# Patient Record
Sex: Female | Born: 1940 | Race: Black or African American | Hispanic: No | Smoking: Former smoker
Health system: Southern US, Community
[De-identification: ages and names within clinical notes are randomized; demographics above are authoritative.]

## PROBLEM LIST (undated history)

## (undated) HISTORY — PX: BREAST BIOPSY: SHX20

## (undated) HISTORY — PX: BREAST EXCISIONAL BIOPSY: SUR124

## (undated) HISTORY — PX: OTHER SURGICAL HISTORY: SHX169

---

## 2014-08-28 ENCOUNTER — Emergency Department: Payer: Medicare Other

## 2014-08-28 ENCOUNTER — Encounter: Payer: Self-pay | Admitting: Emergency Medicine

## 2014-08-28 ENCOUNTER — Emergency Department
Admission: EM | Admit: 2014-08-28 | Discharge: 2014-08-28 | Disposition: A | Discharge status: Home / Self Care | Payer: Medicare Other | Attending: Emergency Medicine | Admitting: Emergency Medicine

## 2014-08-28 DIAGNOSIS — Z87891 Personal history of nicotine dependence: Secondary | ICD-10-CM | POA: Insufficient documentation

## 2014-08-28 DIAGNOSIS — W010XXA Fall on same level from slipping, tripping and stumbling without subsequent striking against object, initial encounter: Secondary | ICD-10-CM | POA: Insufficient documentation

## 2014-08-28 DIAGNOSIS — M25562 Pain in left knee: Secondary | ICD-10-CM

## 2014-08-28 DIAGNOSIS — S93402A Sprain of unspecified ligament of left ankle, initial encounter: Secondary | ICD-10-CM | POA: Insufficient documentation

## 2014-08-28 DIAGNOSIS — Y998 Other external cause status: Secondary | ICD-10-CM | POA: Insufficient documentation

## 2014-08-28 DIAGNOSIS — Y9389 Activity, other specified: Secondary | ICD-10-CM | POA: Insufficient documentation

## 2014-08-28 DIAGNOSIS — Y9289 Other specified places as the place of occurrence of the external cause: Secondary | ICD-10-CM | POA: Diagnosis not present

## 2014-08-28 DIAGNOSIS — S8992XA Unspecified injury of left lower leg, initial encounter: Secondary | ICD-10-CM | POA: Diagnosis present

## 2014-08-28 DIAGNOSIS — S4991XA Unspecified injury of right shoulder and upper arm, initial encounter: Secondary | ICD-10-CM | POA: Diagnosis not present

## 2014-08-28 DIAGNOSIS — M25511 Pain in right shoulder: Secondary | ICD-10-CM

## 2014-08-28 MED ORDER — OXYCODONE-ACETAMINOPHEN 5-325 MG PO TABS
1.0000 | ORAL_TABLET | Freq: Once | ORAL | Status: DC
  Filled 2014-08-28: qty 1

## 2014-08-28 MED ORDER — ACETAMINOPHEN 325 MG PO TABS
650.0000 mg | ORAL_TABLET | Freq: Once | ORAL | Status: AC
  Administered 2014-08-28: 650 mg via ORAL
  Filled 2014-08-28: qty 2

## 2014-08-28 NOTE — ED Notes (Signed)
Pt has declined any opioid medications.

## 2014-08-28 NOTE — Discharge Instructions (Signed)
Ankle Sprain °An ankle sprain is an injury to the strong, fibrous tissues (ligaments) that hold the bones of your ankle joint together.  °CAUSES °An ankle sprain is usually caused by a fall or by twisting your ankle. Ankle sprains most commonly occur when you step on the outer edge of your foot, and your ankle turns inward. People who participate in sports are more prone to these types of injuries.  °SYMPTOMS  °· Pain in your ankle. The pain may be present at rest or only when you are trying to stand or walk. °· Swelling. °· Bruising. Bruising may develop immediately or within 1 to 2 days after your injury. °· Difficulty standing or walking, particularly when turning corners or changing directions. °DIAGNOSIS  °Your caregiver will ask you details about your injury and perform a physical exam of your ankle to determine if you have an ankle sprain. During the physical exam, your caregiver will press on and apply pressure to specific areas of your foot and ankle. Your caregiver will try to move your ankle in certain ways. An X-ray exam may be done to be sure a bone was not broken or a ligament did not separate from one of the bones in your ankle (avulsion fracture).  °TREATMENT  °Certain types of braces can help stabilize your ankle. Your caregiver can make a recommendation for this. Your caregiver may recommend the use of medicine for pain. If your sprain is severe, your caregiver may refer you to a surgeon who helps to restore function to parts of your skeletal system (orthopedist) or a physical therapist. °HOME CARE INSTRUCTIONS  °· Apply ice to your injury for 1-2 days or as directed by your caregiver. Applying ice helps to reduce inflammation and pain. °¨ Put ice in a plastic bag. °¨ Place a towel between your skin and the bag. °¨ Leave the ice on for 15-20 minutes at a time, every 2 hours while you are awake. °· Only take over-the-counter or prescription medicines for pain, discomfort, or fever as directed by  your caregiver. °· Elevate your injured ankle above the level of your heart as much as possible for 2-3 days. °· If your caregiver recommends crutches, use them as instructed. Gradually put weight on the affected ankle. Continue to use crutches or a cane until you can walk without feeling pain in your ankle. °· If you have a plaster splint, wear the splint as directed by your caregiver. Do not rest it on anything harder than a pillow for the first 24 hours. Do not put weight on it. Do not get it wet. You may take it off to take a shower or bath. °· You may have been given an elastic bandage to wear around your ankle to provide support. If the elastic bandage is too tight (you have numbness or tingling in your foot or your foot becomes cold and blue), adjust the bandage to make it comfortable. °· If you have an air splint, you may blow more air into it or let air out to make it more comfortable. You may take your splint off at night and before taking a shower or bath. Wiggle your toes in the splint several times per day to decrease swelling. °SEEK MEDICAL CARE IF:  °· You have rapidly increasing bruising or swelling. °· Your toes feel extremely cold or you lose feeling in your foot. °· Your pain is not relieved with medicine. °SEEK IMMEDIATE MEDICAL CARE IF: °· Your toes are numb or blue. °·   You have severe pain that is increasing. MAKE SURE YOU:   Understand these instructions.  Will watch your condition.  Will get help right away if you are not doing well or get worse. Document Released: 01/12/2005 Document Revised: 10/07/2011 Document Reviewed: 01/24/2011 [facility] Patient Information 2015 [place], [place]. This information is not intended to replace advice given to you by your health care provider. Make sure you discuss any questions you have with your health care provider.  Knee Pain The knee is the complex joint between your thigh and your lower leg. It is made up of bones, tendons, ligaments, and  cartilage. The bones that make up the knee are:  The femur in the thigh.  The tibia and fibula in the lower leg.  The patella or kneecap riding in the groove on the lower femur. CAUSES  Knee pain is a common complaint with many causes. A few of these causes are:  Injury, such as:  A ruptured ligament or tendon injury.  Torn cartilage.  Medical conditions, such as:  Gout  Arthritis  Infections  Overuse, over training, or overdoing a physical activity. Knee pain can be minor or severe. Knee pain can accompany debilitating injury. Minor knee problems often respond well to self-care measures or get well on their own. More serious injuries may need medical intervention or even surgery. SYMPTOMS The knee is complex. Symptoms of knee problems can vary widely. Some of the problems are:  Pain with movement and weight bearing.  Swelling and tenderness.  Buckling of the knee.  Inability to straighten or extend your knee.  Your knee locks and you cannot straighten it.  Warmth and redness with pain and fever.  Deformity or dislocation of the kneecap. DIAGNOSIS  Determining what is wrong may be very straight forward such as when there is an injury. It can also be challenging because of the complexity of the knee. Tests to make a diagnosis may include:  Your caregiver taking a history and doing a physical exam.  Routine X-rays can be used to rule out other problems. X-rays will not reveal a cartilage tear. Some injuries of the knee can be diagnosed by:  Arthroscopy a surgical technique by which a small video camera is inserted through tiny incisions on the sides of the knee. This procedure is used to examine and repair internal knee joint problems. Tiny instruments can be used during arthroscopy to repair the torn knee cartilage (meniscus).  Arthrography is a radiology technique. A contrast liquid is directly injected into the knee joint. Internal structures of the knee joint then  become visible on X-ray film.  An MRI scan is a non X-ray radiology procedure in which magnetic fields and a computer produce two- or three-dimensional images of the inside of the knee. Cartilage tears are often visible using an MRI scanner. MRI scans have largely replaced arthrography in diagnosing cartilage tears of the knee.  Blood work.  Examination of the fluid that helps to lubricate the knee joint (synovial fluid). This is done by taking a sample out using a needle and a syringe. TREATMENT The treatment of knee problems depends on the cause. Some of these treatments are:  Depending on the injury, proper casting, splinting, surgery, or physical therapy care will be needed.  Give yourself adequate recovery time. Do not overuse your joints. If you begin to get sore during workout routines, back off. Slow down or do fewer repetitions.  For repetitive activities such as cycling or running, maintain your strength  and nutrition.  Alternate muscle groups. For example, if you are a weight lifter, work the upper body on one day and the lower body the next.  Either tight or weak muscles do not give the proper support for your knee. Tight or weak muscles do not absorb the stress placed on the knee joint. Keep the muscles surrounding the knee strong.  Take care of mechanical problems.  If you have flat feet, orthotics or special shoes may help. See your caregiver if you need help.  Arch supports, sometimes with wedges on the inner or outer aspect of the heel, can help. These can shift pressure away from the side of the knee most bothered by osteoarthritis.  A brace called an "unloader" brace also may be used to help ease the pressure on the most arthritic side of the knee.  If your caregiver has prescribed crutches, braces, wraps or ice, use as directed. The acronym for this is PRICE. This means protection, rest, ice, compression, and elevation.  Nonsteroidal anti-inflammatory drugs (NSAIDs),  can help relieve pain. But if taken immediately after an injury, they may actually increase swelling. Take NSAIDs with food in your stomach. Stop them if you develop stomach problems. Do not take these if you have a history of ulcers, stomach pain, or bleeding from the bowel. Do not take without your caregiver's approval if you have problems with fluid retention, heart failure, or kidney problems.  For ongoing knee problems, physical therapy may be helpful.  Glucosamine and chondroitin are over-the-counter dietary supplements. Both may help relieve the pain of osteoarthritis in the knee. These medicines are different from the usual anti-inflammatory drugs. Glucosamine may decrease the rate of cartilage destruction.  Injections of a corticosteroid drug into your knee joint may help reduce the symptoms of an arthritis flare-up. They may provide pain relief that lasts a few months. You may have to wait a few months between injections. The injections do have a small increased risk of infection, water retention, and elevated blood sugar levels.  Hyaluronic acid injected into damaged joints may ease pain and provide lubrication. These injections may work by reducing inflammation. A series of shots may give relief for as long as 6 months.  Topical painkillers. Applying certain ointments to your skin may help relieve the pain and stiffness of osteoarthritis. Ask your pharmacist for suggestions. Many over the-counter products are approved for temporary relief of arthritis pain.  In some countries, doctors often prescribe topical NSAIDs for relief of chronic conditions such as arthritis and tendinitis. A review of treatment with NSAID creams found that they worked as well as oral medications but without the serious side effects. PREVENTION  Maintain a healthy weight. Extra pounds put more strain on your joints.  Get strong, stay limber. Weak muscles are a common cause of knee injuries. Stretching is  important. Include flexibility exercises in your workouts.  Be smart about exercise. If you have osteoarthritis, chronic knee pain or recurring injuries, you may need to change the way you exercise. This does not mean you have to stop being active. If your knees ache after jogging or playing basketball, consider switching to swimming, water aerobics, or other low-impact activities, at least for a few days a week. Sometimes limiting high-impact activities will provide relief.  Make sure your shoes fit well. Choose footwear that is right for your sport.  Protect your knees. Use the proper gear for knee-sensitive activities. Use kneepads when playing volleyball or laying carpet. Buckle your seat belt  every time you drive. Most shattered kneecaps occur in car accidents.  Rest when you are tired. SEEK MEDICAL CARE IF:  You have knee pain that is continual and does not seem to be getting better.  SEEK IMMEDIATE MEDICAL CARE IF:  Your knee joint feels hot to the touch and you have a high fever. MAKE SURE YOU:   Understand these instructions.  Will watch your condition.  Will get help right away if you are not doing well or get worse. Document Released: 11/09/2006 Document Revised: 04/06/2011 Document Reviewed: 11/09/2006 [facility] Patient Information 2015 [place], [place]. This information is not intended to replace advice given to you by your health care provider. Make sure you discuss any questions you have with your health care provider.  Shoulder Pain The shoulder is the joint that connects your arms to your body. The bones that form the shoulder joint include the upper arm bone (humerus), the shoulder blade (scapula), and the collarbone (clavicle). The top of the humerus is shaped like a ball and fits into a rather flat socket on the scapula (glenoid cavity). A combination of muscles and strong, fibrous tissues that connect muscles to bones (tendons) support your shoulder joint and hold the ball  in the socket. Small, fluid-filled sacs (bursae) are located in different areas of the joint. They act as cushions between the bones and the overlying soft tissues and help reduce friction between the gliding tendons and the bone as you move your arm. Your shoulder joint allows a wide range of motion in your arm. This range of motion allows you to do things like scratch your back or throw a ball. However, this range of motion also makes your shoulder more prone to pain from overuse and injury. Causes of shoulder pain can originate from both injury and overuse and usually can be grouped in the following four categories:  Redness, swelling, and pain (inflammation) of the tendon (tendinitis) or the bursae (bursitis).  Instability, such as a dislocation of the joint.  Inflammation of the joint (arthritis).  Broken bone (fracture). HOME CARE INSTRUCTIONS   Apply ice to the sore area.  Put ice in a plastic bag.  Place a towel between your skin and the bag.  Leave the ice on for 15-20 minutes, 3-4 times per day for the first 2 days, or as directed by your health care provider.  Stop using cold packs if they do not help with the pain.  If you have a shoulder sling or immobilizer, wear it as long as your caregiver instructs. Only remove it to shower or bathe. Move your arm as little as possible, but keep your hand moving to prevent swelling.  Squeeze a soft ball or foam pad as much as possible to help prevent swelling.  Only take over-the-counter or prescription medicines for pain, discomfort, or fever as directed by your caregiver. SEEK MEDICAL CARE IF:   Your shoulder pain increases, or new pain develops in your arm, hand, or fingers.  Your hand or fingers become cold and numb.  Your pain is not relieved with medicines. SEEK IMMEDIATE MEDICAL CARE IF:   Your arm, hand, or fingers are numb or tingling.  Your arm, hand, or fingers are significantly swollen or turn white or blue. MAKE  SURE YOU:   Understand these instructions.  Will watch your condition.  Will get help right away if you are not doing well or get worse. Document Released: 10/22/2004 Document Revised: 05/29/2013 Document Reviewed: 12/27/2010 [employer] Patient Information  2015 [employer]. This information is not intended to replace advice given to you by your health care provider. Make sure you discuss any questions you have with your health care provider. ° °

## 2014-08-28 NOTE — ED Notes (Signed)
MD at bedside. 

## 2014-08-28 NOTE — ED Notes (Signed)
Pt arrived to the ED for complaints of right arm pain and left knee pain after a mechanica fall. Pt denies LOC. Pt has decreased range of motion on right arm with pulses and sensation intact. Pt reports pain and swelling on left ankle and knee. Pt is AOx4 in no apparent distress.

## 2014-08-28 NOTE — ED Provider Notes (Signed)
[facility] Emergency Department Provider Note  ____________________________________________  Time seen: 9:45 PM  I have reviewed the triage vital signs and the nursing notes.   HISTORY  Chief Complaint Fall; Arm Injury; and Knee Injury      HPI Deborah Johnston is a 74 y.o. female presents with history of accidental slip and fall with resultant right shoulder left knee and left ankle pain. Patient states current pain score 7 out of 10 however patient states that she would not like any opioid analgesia. Patient denies any head injury no loss of consciousne   There are no active problems to display for this patient.   Past Surgical History  Procedure Laterality Date  . Cyst       No current outpatient prescriptions on file.  Allergies Morphine and related  History reviewed. No pertinent family history.  Social History History  Substance Use Topics  . Smoking status: Former Games developer  . Smokeless tobacco: Not on file  . Alcohol Use: Yes    Review of Systems  Constitutional: Negative for fever. Eyes: Negative for visual changes. ENT: Negative for sore throat. Cardiovascular: Negative for chest pain. Respiratory: Negative for shortness of breath. Gastrointestinal: Negative for abdominal pain, vomiting and diarrhea. Genitourinary: Negative for dysuria. Musculoskeletal: Negative for back pain. Positive for right shoulder left knee and left ankle pain Skin: Negative for rash. Neurological: Negative for headaches, focal weakness or numbness.   10-point ROS otherwise negative.  ____________________________________________   PHYSICAL EXAM:  VITAL SIGNS: ED Triage Vitals  Enc Vitals Group     BP 08/28/14 2043 143/69 mmHg     Pulse Rate 08/28/14 2043 68     Resp 08/28/14 2043 18     Temp 08/28/14 2043 98.9 F (37.2 C)     Temp Source 08/28/14 2043 Oral     SpO2 08/28/14 2043 100 %     Weight 08/28/14 2043 175 lb (79.379 kg)     Height  08/28/14 2043 5\' 8"  (1.727 m)     Head Cir --      Peak Flow --      Pain Score 08/28/14 2044 3     Pain Loc --      Pain Edu? --      Excl. in GC? --      Constitutional: Alert and oriented. Well appearing and in no distress. Eyes: Conjunctivae are normal. PERRL. Normal extraocular movements. ENT   Head: Normocephalic and atraumatic.   Nose: No congestion/rhinnorhea.   Mouth/Throat: Mucous membranes are moist.   Neck: No stridor. Hematological/Lymphatic/Immunilogical: No cervical lymphadenopathy. Cardiovascular: Normal rate, regular rhythm. Normal and symmetric distal pulses are present in all extremities. No murmurs, rubs, or gallops. Respiratory: Normal respiratory effort without tachypnea nor retractions. Breath sounds are clear and equal bilaterally. No wheezes/rales/rhonchi. Gastrointestinal: Soft and nontender. No distention. There is no CVA tenderness. Genitourinary: deferred Musculoskeletal: Nontender with normal range of motion in all extremities. No joint effusions.  No lower extremity tenderness nor edema. Pain with palpation of the medial aspect of the left ankle, right shoulder and left ankle. Pain with valgus maneuver the left knee. Neurologic:  Normal speech and language. No gross focal neurologic deficits are appreciated. Speech is normal.  Skin:  Skin is warm, dry and intact. No rash noted. Psychiatric: Mood and affect are normal. Speech and behavior are normal. Patient exhibits appropriate insight and judgment.      RADIOLOGY DG Humerus Right (Final result) Result time: 08/28/14 21:23:43  Final result by Rad Results In Interface (08/28/14 21:23:43)   Narrative:   CLINICAL DATA: Pain following fall  EXAM: RIGHT HUMERUS - 2+ VIEW  COMPARISON: None.  FINDINGS: Frontal and lateral views were obtained. There is no fracture or dislocation. No abnormal periosteal reaction. There is mild osteoarthritic change in the right shoulder  joint.  IMPRESSION: No fracture or dislocation. 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   Electronically Signed By: Bretta Bang III M.D. On: 08/28/2014 21:23          DG Ankle Complete Left (Final result) Result time: 08/28/14 21:24:52   Final result by Rad Results In Interface (08/28/14 21:24:52)   Narrative:   CLINICAL DATA: Left ankle pain tonight. Fell.  EXAM: LEFT ANKLE COMPLETE - 3+ VIEW  COMPARISON: None.  FINDINGS: The ankle mortise is maintained. No acute ankle fracture or osteochondral abnormality. The mid and hindfoot bony structures are intact. Os perineii are noted. Calcaneal spurring changes are noted.  IMPRESSION: No acute ankle fracture.   Electronically Signed By: Rudie Meyer M.D. On: 08/28/2014 21:24          DG Knee Complete 4 Views Left (Final result) Result time: 08/28/14 21:24:32   Final result by Rad Results In Interface (08/28/14 21:24:32)   Narrative:   CLINICAL DATA: Acute onset of left knee pain after fall. Initial encounter.  EXAM: LEFT KNEE - COMPLETE 4+ VIEW  COMPARISON: None.  FINDINGS: A small osseous fragment at the proximal insertion of the medial collateral ligament may reflect remote injury and a Pellegrini-Stieda lesion. There is no definite evidence of fracture. The joint spaces are preserved. No significant degenerative change is seen; the patellofemoral joint is grossly unremarkable in appearance.  Trace knee joint fluid remains within normal limits. The visualized soft tissues are normal in appearance.  IMPRESSION: 1. No definite evidence of acute fracture or dislocation. 2. Apparent Pellegrini-Stieda lesion; would correlate for evidence of prior medial collateral ligament injury.   Electronically Signed By: Roanna Raider M.D. On: 08/28/2014 21:24          INITIAL IMPRESSION / ASSESSMENT AND PLAN / ED COURSE  Pertinent labs & imaging results that  were available during my care of the patient were reviewed by me and considered in my medical decision making (see chart for details).  Knee immobilizer applied to the left knee ankle stirrup applied to the left ankle. Discussed x-ray findings with the patient possibility of ligamentous injury both in her right shoulder and left knee was ankle. Patient will be referred to Dr. Ernest Pine for outpatient follow-up.  ____________________________________________   FINAL CLINICAL IMPRESSION(S) / ED DIAGNOSES  Final diagnoses:  Left ankle sprain, initial encounter  Knee pain, acute, left  Right shoulder pain      Darci Current, MD 08/28/14 2306

## 2014-09-03 ENCOUNTER — Other Ambulatory Visit: Payer: Self-pay | Admitting: Orthopedic Surgery

## 2014-09-03 DIAGNOSIS — M25511 Pain in right shoulder: Secondary | ICD-10-CM

## 2014-09-03 DIAGNOSIS — S46001A Unspecified injury of muscle(s) and tendon(s) of the rotator cuff of right shoulder, initial encounter: Secondary | ICD-10-CM

## 2014-09-06 ENCOUNTER — Ambulatory Visit
Admission: RE | Admit: 2014-09-06 | Discharge: 2014-09-06 | Disposition: A | Discharge status: Home / Self Care | Payer: Medicare Other | Source: Ambulatory Visit | Attending: Orthopedic Surgery | Admitting: Orthopedic Surgery

## 2014-09-06 DIAGNOSIS — W19XXXA Unspecified fall, initial encounter: Secondary | ICD-10-CM | POA: Diagnosis not present

## 2014-09-06 DIAGNOSIS — M25511 Pain in right shoulder: Secondary | ICD-10-CM

## 2014-09-06 DIAGNOSIS — M19011 Primary osteoarthritis, right shoulder: Secondary | ICD-10-CM | POA: Diagnosis not present

## 2014-09-06 DIAGNOSIS — M25411 Effusion, right shoulder: Secondary | ICD-10-CM | POA: Insufficient documentation

## 2014-09-06 DIAGNOSIS — M75121 Complete rotator cuff tear or rupture of right shoulder, not specified as traumatic: Secondary | ICD-10-CM | POA: Insufficient documentation

## 2014-09-06 DIAGNOSIS — S46001A Unspecified injury of muscle(s) and tendon(s) of the rotator cuff of right shoulder, initial encounter: Secondary | ICD-10-CM

## 2014-09-08 ENCOUNTER — Ambulatory Visit: Payer: Medicare Other

## 2014-09-17 ENCOUNTER — Encounter
Admission: RE | Admit: 2014-09-17 | Discharge: 2014-09-17 | Disposition: A | Discharge status: Home / Self Care | Payer: Medicare Other | Source: Ambulatory Visit | Attending: Surgery | Admitting: Surgery

## 2014-09-17 DIAGNOSIS — Z881 Allergy status to other antibiotic agents status: Secondary | ICD-10-CM | POA: Diagnosis not present

## 2014-09-17 DIAGNOSIS — Z79899 Other long term (current) drug therapy: Secondary | ICD-10-CM | POA: Diagnosis not present

## 2014-09-17 DIAGNOSIS — Z87891 Personal history of nicotine dependence: Secondary | ICD-10-CM | POA: Diagnosis not present

## 2014-09-17 DIAGNOSIS — M19011 Primary osteoarthritis, right shoulder: Secondary | ICD-10-CM | POA: Diagnosis not present

## 2014-09-17 DIAGNOSIS — W109XXA Fall (on) (from) unspecified stairs and steps, initial encounter: Secondary | ICD-10-CM | POA: Diagnosis not present

## 2014-09-17 DIAGNOSIS — S46011A Strain of muscle(s) and tendon(s) of the rotator cuff of right shoulder, initial encounter: Secondary | ICD-10-CM | POA: Diagnosis not present

## 2014-09-17 DIAGNOSIS — M7521 Bicipital tendinitis, right shoulder: Secondary | ICD-10-CM | POA: Diagnosis not present

## 2014-09-17 DIAGNOSIS — M75121 Complete rotator cuff tear or rupture of right shoulder, not specified as traumatic: Secondary | ICD-10-CM | POA: Diagnosis present

## 2014-09-17 DIAGNOSIS — Z791 Long term (current) use of non-steroidal anti-inflammatories (NSAID): Secondary | ICD-10-CM | POA: Diagnosis not present

## 2014-09-17 DIAGNOSIS — Z8669 Personal history of other diseases of the nervous system and sense organs: Secondary | ICD-10-CM | POA: Diagnosis not present

## 2014-09-17 DIAGNOSIS — Z885 Allergy status to narcotic agent status: Secondary | ICD-10-CM | POA: Diagnosis not present

## 2014-09-17 NOTE — Patient Instructions (Addendum)
  Your procedure is scheduled on: 09/18/14 Report to Day Surgery. @ 8:20   Remember: Instructions that are not followed completely may result in serious medical risk, up to and including death, or upon the discretion of your surgeon and anesthesiologist your surgery may need to be rescheduled.    _x___ 1. Do not eat food or drink liquids after midnight. No gum chewing or hard candies.     _x___ 2. No Alcohol for 24 hours before or after surgery.   ____ 3. Bring all medications with you on the day of surgery if instructed.    __x_ 4. Notify your doctor if there is any change in your medical condition     (cold, fever, infections).     Do not wear jewelry, make-up, hairpins, clips or nail polish.  Do not wear lotions, powders, or perfumes. You may wear deodorant.  Do not shave 48 hours prior to surgery. Men may shave face and neck.  Do not bring valuables to the hospital.    [facility] is not responsible for any belongings or valuables.               Contacts, dentures or bridgework may not be worn into surgery.  Leave your suitcase in the car. After surgery it may be brought to your room.  For patients admitted to the hospital, discharge time is determined by your                treatment team.   Patients discharged the day of surgery will not be allowed to drive home.   Please read over the following fact sheets that you were given:      ____ Take these medicines the morning of surgery with A SIP OF WATER:    1. None  2.   3.   4.  5.  6.  ____ Fleet Enema (as directed)   _x___ Use CHG Soap as directed  ____ Use inhalers on the day of surgery  ____ Stop metformin 2 days prior to surgery    ____ Take 1/2 of usual insulin dose the night before surgery and none on the morning of surgery.   __x__ Stop Coumadin/Plavix/aspirin on  Stopped 18th  ____ Stop Anti-inflammatories on    ____ Stop supplements until after surgery.    ____ Bring C-Pap to the hospital.

## 2014-09-18 ENCOUNTER — Ambulatory Visit: Payer: Medicare Other | Admitting: Anesthesiology

## 2014-09-18 ENCOUNTER — Ambulatory Visit
Admission: RE | Admit: 2014-09-18 | Discharge: 2014-09-18 | Disposition: A | Discharge status: Home / Self Care | Payer: Medicare Other | Source: Ambulatory Visit | Attending: Surgery | Admitting: Surgery

## 2014-09-18 ENCOUNTER — Encounter: Payer: Self-pay | Admitting: *Deleted

## 2014-09-18 ENCOUNTER — Encounter
Admission: RE | Disposition: A | Discharge status: Home / Self Care | Payer: Self-pay | Source: Ambulatory Visit | Attending: Surgery

## 2014-09-18 DIAGNOSIS — S46011A Strain of muscle(s) and tendon(s) of the rotator cuff of right shoulder, initial encounter: Secondary | ICD-10-CM | POA: Diagnosis not present

## 2014-09-18 DIAGNOSIS — Z79899 Other long term (current) drug therapy: Secondary | ICD-10-CM | POA: Insufficient documentation

## 2014-09-18 DIAGNOSIS — Z791 Long term (current) use of non-steroidal anti-inflammatories (NSAID): Secondary | ICD-10-CM | POA: Insufficient documentation

## 2014-09-18 DIAGNOSIS — M7521 Bicipital tendinitis, right shoulder: Secondary | ICD-10-CM | POA: Insufficient documentation

## 2014-09-18 DIAGNOSIS — Z8669 Personal history of other diseases of the nervous system and sense organs: Secondary | ICD-10-CM | POA: Insufficient documentation

## 2014-09-18 DIAGNOSIS — Z87891 Personal history of nicotine dependence: Secondary | ICD-10-CM | POA: Insufficient documentation

## 2014-09-18 DIAGNOSIS — W109XXA Fall (on) (from) unspecified stairs and steps, initial encounter: Secondary | ICD-10-CM | POA: Insufficient documentation

## 2014-09-18 DIAGNOSIS — Z885 Allergy status to narcotic agent status: Secondary | ICD-10-CM | POA: Insufficient documentation

## 2014-09-18 DIAGNOSIS — M19011 Primary osteoarthritis, right shoulder: Secondary | ICD-10-CM | POA: Insufficient documentation

## 2014-09-18 DIAGNOSIS — Z881 Allergy status to other antibiotic agents status: Secondary | ICD-10-CM | POA: Insufficient documentation

## 2014-09-18 HISTORY — PX: SHOULDER ARTHROSCOPY WITH OPEN ROTATOR CUFF REPAIR: SHX6092

## 2014-09-18 SURGERY — ARTHROSCOPY, SHOULDER WITH REPAIR, ROTATOR CUFF, OPEN
Anesthesia: General | Site: Shoulder | Laterality: Right | Wound class: Clean

## 2014-09-18 MED ORDER — LACTATED RINGERS IV SOLN
INTRAVENOUS | Status: DC
  Administered 2014-09-18 (×2): via INTRAVENOUS
  Administered 2014-09-18: 1000 mL via INTRAVENOUS

## 2014-09-18 MED ORDER — BUPIVACAINE-EPINEPHRINE (PF) 0.5% -1:200000 IJ SOLN
INTRAMUSCULAR | Status: AC
  Filled 2014-09-18: qty 30

## 2014-09-18 MED ORDER — HYDROCODONE-ACETAMINOPHEN 5-325 MG PO TABS
ORAL_TABLET | ORAL | Status: AC
  Filled 2014-09-18: qty 1

## 2014-09-18 MED ORDER — KETOROLAC TROMETHAMINE 30 MG/ML IJ SOLN
INTRAMUSCULAR | Status: DC | PRN
  Administered 2014-09-18: 30 mg via INTRAVENOUS

## 2014-09-18 MED ORDER — METOCLOPRAMIDE HCL 5 MG/ML IJ SOLN: 5.0000 mg | Freq: Three times a day (TID) | INTRAMUSCULAR | Status: DC | PRN

## 2014-09-18 MED ORDER — BUPIVACAINE-EPINEPHRINE 0.5% -1:200000 IJ SOLN
INTRAMUSCULAR | Status: DC | PRN
  Administered 2014-09-18: 20 mL

## 2014-09-18 MED ORDER — FENTANYL CITRATE (PF) 100 MCG/2ML IJ SOLN: 25.0000 ug | INTRAMUSCULAR | Status: DC | PRN

## 2014-09-18 MED ORDER — NEOSTIGMINE METHYLSULFATE 10 MG/10ML IV SOLN
INTRAVENOUS | Status: DC | PRN
  Administered 2014-09-18: 3 mg via INTRAVENOUS
  Administered 2014-09-18: 1 mg via INTRAVENOUS

## 2014-09-18 MED ORDER — SODIUM CHLORIDE 0.9 % IV SOLN
10000.0000 ug | INTRAVENOUS | Status: DC | PRN
  Administered 2014-09-18 (×7): .1 ug via INTRAVENOUS

## 2014-09-18 MED ORDER — MIDAZOLAM HCL 2 MG/2ML IJ SOLN
INTRAMUSCULAR | Status: DC | PRN
  Administered 2014-09-18: 0.5 mg via INTRAVENOUS
  Administered 2014-09-18: 1.5 mg via INTRAVENOUS

## 2014-09-18 MED ORDER — METOCLOPRAMIDE HCL 10 MG PO TABS: 5.0000 mg | ORAL_TABLET | Freq: Three times a day (TID) | ORAL | Status: DC | PRN

## 2014-09-18 MED ORDER — ROPIVACAINE HCL 5 MG/ML IJ SOLN
INTRAMUSCULAR | Status: AC
  Filled 2014-09-18: qty 40

## 2014-09-18 MED ORDER — ONDANSETRON HCL 4 MG/2ML IJ SOLN: 4.0000 mg | Freq: Once | INTRAMUSCULAR | Status: DC | PRN

## 2014-09-18 MED ORDER — ONDANSETRON HCL 4 MG PO TABS: 4.0000 mg | ORAL_TABLET | Freq: Four times a day (QID) | ORAL | Status: DC | PRN

## 2014-09-18 MED ORDER — FENTANYL CITRATE (PF) 100 MCG/2ML IJ SOLN
INTRAMUSCULAR | Status: DC | PRN
  Administered 2014-09-18 (×2): 50 ug via INTRAVENOUS

## 2014-09-18 MED ORDER — FAMOTIDINE 20 MG PO TABS
20.0000 mg | ORAL_TABLET | Freq: Once | ORAL | Status: AC
  Administered 2014-09-18: 20 mg via ORAL

## 2014-09-18 MED ORDER — GLYCOPYRROLATE 0.2 MG/ML IJ SOLN
INTRAMUSCULAR | Status: DC | PRN
  Administered 2014-09-18: 0.2 mg via INTRAVENOUS
  Administered 2014-09-18: 0.6 mg via INTRAVENOUS

## 2014-09-18 MED ORDER — ONDANSETRON HCL 4 MG/2ML IJ SOLN
INTRAMUSCULAR | Status: DC | PRN
  Administered 2014-09-18: 4 mg via INTRAVENOUS

## 2014-09-18 MED ORDER — CEFAZOLIN SODIUM-DEXTROSE 2-3 GM-% IV SOLR
2.0000 g | Freq: Once | INTRAVENOUS | Status: AC
  Administered 2014-09-18: 2 g via INTRAVENOUS

## 2014-09-18 MED ORDER — ONDANSETRON HCL 4 MG/2ML IJ SOLN: 4.0000 mg | Freq: Four times a day (QID) | INTRAMUSCULAR | Status: DC | PRN

## 2014-09-18 MED ORDER — EPINEPHRINE HCL 1 MG/ML IJ SOLN
INTRAMUSCULAR | Status: AC
  Filled 2014-09-18: qty 2

## 2014-09-18 MED ORDER — EPINEPHRINE HCL 1 MG/ML IJ SOLN
INTRAMUSCULAR | Status: DC | PRN
  Administered 2014-09-18: 2 mg via INTRAMUSCULAR

## 2014-09-18 MED ORDER — PROPOFOL 10 MG/ML IV BOLUS
INTRAVENOUS | Status: DC | PRN
  Administered 2014-09-18: 50 mg via INTRAVENOUS
  Administered 2014-09-18: 150 mg via INTRAVENOUS

## 2014-09-18 MED ORDER — PHENYLEPHRINE HCL 10 MG/ML IJ SOLN
INTRAMUSCULAR | Status: DC | PRN
  Administered 2014-09-18 (×3): 100 ug via INTRAVENOUS

## 2014-09-18 MED ORDER — FAMOTIDINE 20 MG PO TABS
ORAL_TABLET | ORAL | Status: AC
  Administered 2014-09-18: 20 mg via ORAL
  Filled 2014-09-18: qty 1

## 2014-09-18 MED ORDER — HYDROCODONE-ACETAMINOPHEN 5-325 MG PO TABS
1.0000 | ORAL_TABLET | ORAL | Status: DC | PRN
  Administered 2014-09-18: 1 via ORAL

## 2014-09-18 MED ORDER — TRAMADOL HCL 50 MG PO TABS: ORAL_TABLET | ORAL | Status: AC

## 2014-09-18 MED ORDER — ROCURONIUM BROMIDE 100 MG/10ML IV SOLN
INTRAVENOUS | Status: DC | PRN
  Administered 2014-09-18: 50 mg via INTRAVENOUS
  Administered 2014-09-18: 10 mg via INTRAVENOUS

## 2014-09-18 MED ORDER — CEFAZOLIN SODIUM-DEXTROSE 2-3 GM-% IV SOLR
INTRAVENOUS | Status: AC
  Filled 2014-09-18: qty 50

## 2014-09-18 SURGICAL SUPPLY — 48 items
ANCHOR JUGGERKNOT WTAP NDL 2.9 (Anchor) ×6 IMPLANT
BIT DRILL JUGRKNT W/NDL BIT2.9 (DRILL) ×1 IMPLANT
BLADE FULL RADIUS 3.5 (BLADE) IMPLANT
BLADE SHAVER 4.5X7 STR FR (MISCELLANEOUS) ×2 IMPLANT
BUR ACROMIONIZER 4.0 (BURR) ×2 IMPLANT
BUR BR 5.5 WIDE MOUTH (BURR) IMPLANT
CANNULA 8.5X75 THRED (CANNULA) IMPLANT
CANNULA SHAVER 8MMX76MM (CANNULA) ×2 IMPLANT
CHLORAPREP W/TINT 26ML (MISCELLANEOUS) ×4 IMPLANT
COVER MAYO STAND STRL (DRAPES) ×2 IMPLANT
DRAPE IMP U-DRAPE 54X76 (DRAPES) ×2 IMPLANT
DRAPE SURG 17X11 SM STRL (DRAPES) ×2 IMPLANT
DRILL JUGGERKNOT W/NDL BIT 2.9 (DRILL) ×2
DRSG OPSITE POSTOP 4X8 (GAUZE/BANDAGES/DRESSINGS) IMPLANT
GAUZE PETRO XEROFOAM 1X8 (MISCELLANEOUS) ×2 IMPLANT
GAUZE SPONGE 4X4 12PLY STRL (GAUZE/BANDAGES/DRESSINGS) ×2 IMPLANT
GLOVE BIO SURGEON STRL SZ7.5 (GLOVE) ×4 IMPLANT
GLOVE BIO SURGEON STRL SZ8 (GLOVE) ×4 IMPLANT
GLOVE BIOGEL PI IND STRL 8 (GLOVE) ×1 IMPLANT
GLOVE BIOGEL PI INDICATOR 8 (GLOVE) ×1
GLOVE INDICATOR 8.0 STRL GRN (GLOVE) ×2 IMPLANT
GOWN STRL REUS W/ TWL LRG LVL3 (GOWN DISPOSABLE) ×2 IMPLANT
GOWN STRL REUS W/ TWL XL LVL3 (GOWN DISPOSABLE) ×1 IMPLANT
GOWN STRL REUS W/TWL LRG LVL3 (GOWN DISPOSABLE) ×2
GOWN STRL REUS W/TWL XL LVL3 (GOWN DISPOSABLE) ×1
GRASPER SUT 15 45D LOW PRO (SUTURE) IMPLANT
IV LACTATED RINGER IRRG 3000ML (IV SOLUTION) ×2
IV LR IRRIG 3000ML ARTHROMATIC (IV SOLUTION) ×2 IMPLANT
JuggerKnot Soft Anchirs ×2 IMPLANT
JuggerKnot Soft Anchors ×4 IMPLANT
MANIFOLD NEPTUNE II (INSTRUMENTS) ×2 IMPLANT
MASK FACE SPIDER DISP (MASK) ×2 IMPLANT
MAT BLUE FLOOR 46X72 FLO (MISCELLANEOUS) ×2 IMPLANT
NEEDLE REVERSE CUT 1/2 CRC (NEEDLE) IMPLANT
PACK ARTHROSCOPY SHOULDER (MISCELLANEOUS) ×2 IMPLANT
PAD GROUND ADULT SPLIT (MISCELLANEOUS) ×2 IMPLANT
SLING ARM LRG DEEP (SOFTGOODS) IMPLANT
SLING ULTRA II LG (MISCELLANEOUS) ×2 IMPLANT
STAPLER SKIN PROX 35W (STAPLE) IMPLANT
STRAP SAFETY BODY (MISCELLANEOUS) ×2 IMPLANT
SUT ETHIBOND 0 MO6 C/R (SUTURE) ×2 IMPLANT
SUT PROLENE 4 0 PS 2 18 (SUTURE) IMPLANT
SUT VIC AB 2-0 CT1 27 (SUTURE) ×1
SUT VIC AB 2-0 CT1 TAPERPNT 27 (SUTURE) ×1 IMPLANT
TAPE MICROFOAM 4IN (TAPE) ×2 IMPLANT
TUBING ARTHRO INFLOW-ONLY STRL (TUBING) ×2 IMPLANT
TUBING CONNECTING 10 (TUBING) ×2 IMPLANT
WAND HAND CNTRL MULTIVAC 90 (MISCELLANEOUS) ×2 IMPLANT

## 2014-09-18 NOTE — H&P (Signed)
Paper H&P to be scanned into permanent record. H&P reviewed. No changes. 

## 2014-09-18 NOTE — Anesthesia Postprocedure Evaluation (Signed)
  Anesthesia Post-op Note  Patient: Deborah Johnston  Procedure(s) Performed: Procedure(s): SHOULDER ARTHROSCOPY,  debridement, decompression, biceps tenodesis,  and OPEN ROTATOR CUFF REPAIR (Right)  Anesthesia type:General  Patient location: PACU  Post pain: Pain level controlled  Post assessment: Post-op Vital signs reviewed, Patient's Cardiovascular Status Stable, Respiratory Function Stable, Patent Airway and No signs of Nausea or vomiting  Post vital signs: Reviewed and stable  Last Vitals:  Filed Vitals:   09/18/14 1401  BP: 143/71  Pulse: 67  Temp:   Resp: 16    Level of consciousness: awake, alert  and patient cooperative  Complications: No apparent anesthesia complications

## 2014-09-18 NOTE — Anesthesia Procedure Notes (Addendum)
Procedure Name: Intubation Date/Time: 09/18/2014 9:51 AM Performed by: Charna Busman Pre-anesthesia Checklist: Patient identified, Emergency Drugs available, Suction available and Patient being monitored Patient Re-evaluated:Patient Re-evaluated prior to inductionOxygen Delivery Method: Circle system utilized Preoxygenation: Pre-oxygenation with 100% oxygen Intubation Type: IV induction and Combination inhalational/ intravenous induction Ventilation: Mask ventilation without difficulty Laryngoscope Size: Miller and 3 Grade View: Grade II Tube type: Oral Tube size: 7.0 mm Number of attempts: 1 Airway Equipment and Method: Stylet Placement Confirmation: ETT inserted through vocal cords under direct vision,  positive ETCO2,  CO2 detector and breath sounds checked- equal and bilateral Secured at: 22 cm Tube secured with: Tape Dental Injury: Teeth and Oropharynx as per pre-operative assessment     Anesthesia Regional Block:  Interscalene brachial plexus block  Pre-Anesthetic Checklist: ,, timeout performed, Correct Patient, Correct Site, Correct Laterality, Correct Procedure, Correct Position, site marked, Risks and benefits discussed,  Surgical consent,  Pre-op evaluation,  At surgeon's request and post-op pain management  Laterality: Right  Prep: alcohol swabs       Needles:  Injection technique: Single-shot  Needle Type: Stimiplex     Needle Length: 5cm 5 cm Needle Gauge: 22 and 22 G    Additional Needles:  Procedures: nerve stimulator Interscalene brachial plexus block  Nerve Stimulator or Paresthesia:  Response: biceps flexion,   Additional Responses:   Narrative:  Start time: 09/18/2014 9:28 AM End time: 09/18/2014 9:37 AM Injection made incrementally with aspirations every 5 mL.  Performed by: Personally   Additional Notes: Functioning IV was confirmed and monitors were applied.  A 50mm 22ga Stimuplex needle was used. Sterile prep and drape,hand hygiene and  sterile gloves were used.  Negative aspiration and negative test dose prior to incremental administration of local anesthetic. The patient tolerated the procedure well.

## 2014-09-18 NOTE — Transfer of Care (Signed)
Immediate Anesthesia Transfer of Care Note  Patient: Deborah Johnston  Procedure(s) Performed: Procedure(s): SHOULDER ARTHROSCOPY,  debridement, decompression, biceps tenodesis,  and OPEN ROTATOR CUFF REPAIR (Right)  Patient Location: PACU  Anesthesia Type:General  Level of Consciousness: awake  Airway & Oxygen Therapy: Patient Spontanous Breathing and Patient connected to face mask oxygen  Post-op Assessment: Report given to RN and Post -op Vital signs reviewed and stable  Post vital signs: Reviewed and stable  Last Vitals:  Filed Vitals:   09/18/14 1222  BP: 129/78  Pulse: 54  Temp: 35.8 C  Resp: 8    Complications: No apparent anesthesia complications

## 2014-09-18 NOTE — Progress Notes (Signed)
Right radial pulse palpable. Right fingers pink in color/warm to touch.

## 2014-09-18 NOTE — Progress Notes (Signed)
Upper dentures returned to pt  

## 2014-09-18 NOTE — Op Note (Signed)
09/18/2014  12:16 PM  Patient:   Deborah Johnston  Pre-Op Diagnosis:   Massive rotator cuff tear, right shoulder.  Postoperative diagnosis: Massive rotator cuff tear with biceps tendinopathy and early degenerative joint disease, right shoulder.  Procedure: Extensive arthroscopic debridement, arthroscopic subacromial decompression, mini-open rotator cuff repair, and mini-open biceps tenodesis, right shoulder.  Anesthesia: General endotracheal with interscalene block placed preoperatively by the anesthesiologist.  Surgeon:   Maryagnes Amos, MD  Assistant:   None  Findings: As above. Grade 3 chondromalacial changes were noted involving the anterosuperior aspect of the humeral head, and grade 2 chondral malacia changes were noted involving the remainder of the humeral head as well as the glenoid. There was degenerative fraying of the labrum superiorly and anteriorly without labral detachment. The superior insertional fibers of the subscapularis tendon were noted generally torn, resulting in instability of the long head of the biceps tendon. There was significant tendinopathy changes with partial-thickness tearing of the intra-articular portion of the long head of biceps tendon.  Complications: None  Fluids:   1000 cc  Estimated blood loss: 10 cc  Tourniquet time: None  Drains: None  Closure: Staples   Brief clinical note: The patient is a 75 year old female with a 2 to three-week history of right shoulder pain and weakness following an injury in which she tripped and fell onto her shoulder. The patient's symptoms have progressed despite medications, activity modification, etc. The patient's history and examination are consistent with impingement/tendinopathy with a rotator cuff tear. These findings were confirmed by MRI scan. The patient presents at this time for definitive management of these shoulder symptoms.  Procedure: The patient was brought into the  operating room and lain in the supine position. After adequate IV sedation was achieved, the patient underwent placement of an interscalene block by the anesthesiologist. The patient then underwent general endotracheal intubation and anesthesia before being repositioned in the beach chair position using the beach chair positioner. The right shoulder and upper extremity were prepped with ChloraPrep solution before being draped sterilely. Preoperative antibiotics were administered. A timeout was performed to confirm the proper side before the expected portal sites and incision site were injected with 0.5% Sensorcaine with epinephrine. A posterior portal was created and the glenohumeral joint thoroughly inspected with the findings as described above. An anterior portal was created using an outside-in technique. The labrum and rotator cuff were further probed, again confirming the above-noted findings. The areas of abundant reactive synovial tissues anteriorly, superiorly, and posteriorly were debrided back to stable margins using the full-radius resector. The biceps tendon was quite degenerative the torn up, so it was released from its labral attachment using the ArthroCare wand. The bicipital stump was then contoured with the ArthroCare wand. The ArthroCare wand also was used to obtain hemostasis before the instruments were removed from the joint after suctioning the excess fluid.  The camera was repositioned through the posterior portal into the subacromial space. A separate lateral portal was created using an outside-in technique. The 3.5 mm full-radius resector was introduced and used to perform a subtotal bursectomy. The ArthroCare wand was then inserted and used to remove the periosteal tissue off the undersurface of the anterior third of the acromion as well as to recess the coracoacromial ligament from its attachment along the anterior and lateral margins of the acromion. The 4.0 mm acromionizing bur was  introduced and used to complete the decompression by removing the undersurface of the anterior third of the acromion. The full radius resector  was reintroduced to remove any residual bony debris before the ArthroCare wand was reintroduced to obtain hemostasis. The instruments were then removed from the subacromial space after suctioning the excess fluid.  An approximately 4-5 cm incision was made over the anterolateral aspect of the shoulder beginning at the anterolateral corner of the acromion and extending distally in line with the bicipital groove. This incision was carried down through the subcutaneous tissues to expose the deltoid fascia. The raphae between the anterior and middle thirds was identified and this plane developed to provide access into the subacromial space. Additional bursal tissues were debrided sharply using Metzenbaum scissors. The rotator cuff tear was readily identified. The margins were debrided sharply with a #15 blade and the exposed greater tuberosity roughened with a rongeur. The tear was repaired using several #0 Ethibond interrupted sutures to close the longitudinal portion of the tear. Laterally, the tear was secured using two Biomet 2.9 mm JuggerKnot anchors. Several of these sutures were then brought back laterally through bone tunnels and tied over bone bridges to create a two-layer closure. Several additional #0 Ethibond interrupted sutures were used to reinforce this closure. An apparent watertight closure was obtained.  The bicipital groove was identified by palpation and opened for 1-1.5 cm. The biceps tendon stump was retrieved through this defect. The floor of the bicipital groove was roughened with a curet before another Biomet 2.9 mm JuggerKnot anchor was inserted. Both sets of sutures were passed through the biceps tendon to effect the tenodesis. The bicipital sheath was reapproximated using two #0 Ethibond interrupted sutures, incorporating the biceps tendon to  further reinforce the tenodesis.  The wound was copiously irrigated with sterile saline solution before the deltoid raphae was reapproximated using 2-0 Vicryl interrupted sutures. The subcutaneous tissues were closed in two layers using 2-0 Vicryl interrupted sutures before the skin was closed using staples. The portal sites also were closed using staples. A sterile bulky dressing was applied to the shoulder before the arm was placed into a shoulder immobilizer. The patient was then awakened, extubated, and returned to the recovery room in satisfactory condition after tolerating the procedure well.

## 2014-09-18 NOTE — Discharge Instructions (Addendum)
Keep dressing dry and intact.  May remove dressing on post-op day #4 (Saturday).  Cover staples/sutures with Band-Aids after sponge-bathing. Apply ice frequently to shoulder. Keep shoulder immobilizer on at all times except may remove for bathing purposes. Follow-up in 10-14 days or as scheduled.  AMBULATORY SURGERY  DISCHARGE INSTRUCTIONS   1) The drugs that you were given will stay in your system until tomorrow so for the next 24 hours you should not:  A) Drive an automobile B) Make any legal decisions C) Drink any alcoholic beverage   2) You may resume regular meals tomorrow.  Today it is better to start with liquids and gradually work up to solid foods.  You may eat anything you prefer, but it is better to start with liquids, then soup and crackers, and gradually work up to solid foods.   3) Please notify your doctor immediately if you have any unusual bleeding, trouble breathing, redness and pain at the surgery site, drainage, fever, or pain not relieved by medication.    4) Additional Instructions:   Please contact your physician with any problems or Same Day Surgery at [phone], Monday through Friday 6 am to 4 pm, or [place] at [facility] number at [phone].

## 2014-09-18 NOTE — Anesthesia Preprocedure Evaluation (Signed)
Anesthesia Evaluation  Patient identified by MRN, date of birth, ID band Patient awake    Reviewed: Allergy & Precautions, NPO status , Patient's Chart, lab work & pertinent test results  History of Anesthesia Complications Negative for: history of anesthetic complications  Airway Mallampati: II  TM Distance: >3 FB Neck ROM: Full    Dental  (+) Upper Dentures, Partial Lower   Pulmonary former smoker (quit x 33 yrs),          Cardiovascular negative cardio ROS      Neuro/Psych Seizures - (none since 74 yo),     GI/Hepatic negative GI ROS, Neg liver ROS,   Endo/Other  negative endocrine ROS  Renal/GU negative Renal ROS     Musculoskeletal   Abdominal   Peds  Hematology negative hematology ROS (+)   Anesthesia Other Findings   Reproductive/Obstetrics                             Anesthesia Physical Anesthesia Plan  ASA: II  Anesthesia Plan: General   Post-op Pain Management: MAC Combined w/ Regional for Post-op pain   Induction: Intravenous  Airway Management Planned: Oral ETT  Additional Equipment:   Intra-op Plan:   Post-operative Plan:   Informed Consent: I have reviewed the patients History and Physical, chart, labs and discussed the procedure including the risks, benefits and alternatives for the proposed anesthesia with the patient or authorized representative who has indicated his/her understanding and acceptance.     Plan Discussed with:   Anesthesia Plan Comments:         Anesthesia Quick Evaluation

## 2014-09-18 NOTE — Addendum Note (Signed)
Addendum  created 09/18/14 1547 by Naomie Dean, MD   Modules edited: Anesthesia Blocks and Procedures, Clinical Notes   Clinical Notes:  File: [id number]

## 2016-08-24 ENCOUNTER — Emergency Department
Admission: EM | Admit: 2016-08-24 | Discharge: 2016-08-25 | Disposition: A | Discharge status: Home / Self Care | Payer: Medicare Other | Attending: Emergency Medicine | Admitting: Emergency Medicine

## 2016-08-24 ENCOUNTER — Encounter: Payer: Self-pay | Admitting: Emergency Medicine

## 2016-08-24 DIAGNOSIS — R42 Dizziness and giddiness: Secondary | ICD-10-CM | POA: Diagnosis present

## 2016-08-24 DIAGNOSIS — R1110 Vomiting, unspecified: Secondary | ICD-10-CM | POA: Diagnosis not present

## 2016-08-24 DIAGNOSIS — Z7982 Long term (current) use of aspirin: Secondary | ICD-10-CM | POA: Diagnosis not present

## 2016-08-24 DIAGNOSIS — R040 Epistaxis: Secondary | ICD-10-CM | POA: Insufficient documentation

## 2016-08-24 DIAGNOSIS — Z87891 Personal history of nicotine dependence: Secondary | ICD-10-CM | POA: Insufficient documentation

## 2016-08-24 LAB — CBC
HCT: 40.9 % (ref 35.0–47.0)
Hemoglobin: 14.1 g/dL (ref 12.0–16.0)
MCH: 33.5 pg (ref 26.0–34.0)
MCHC: 34.3 g/dL (ref 32.0–36.0)
MCV: 97.6 fL (ref 80.0–100.0)
Platelets: 277 10*3/uL (ref 150–440)
RBC: 4.19 MIL/uL (ref 3.80–5.20)
RDW: 13.1 % (ref 11.5–14.5)
WBC: 8 10*3/uL (ref 3.6–11.0)

## 2016-08-24 LAB — BASIC METABOLIC PANEL
Anion gap: 7 (ref 5–15)
BUN: 17 mg/dL (ref 6–20)
CALCIUM: 9.6 mg/dL (ref 8.9–10.3)
CO2: 29 mmol/L (ref 22–32)
Chloride: 104 mmol/L (ref 101–111)
Creatinine, Ser: 0.9 mg/dL (ref 0.44–1.00)
GFR calc Af Amer: >60 mL/min (ref >60)
GLUCOSE: 123 mg/dL — AB (ref 65–99)
Potassium: 4.4 mmol/L (ref 3.5–5.1)
Sodium: 140 mmol/L (ref 135–145)

## 2016-08-24 NOTE — ED Triage Notes (Signed)
Pt presents to ED c/o dizziness since last Wednesday.Pt states she vomiting Thursday morning and no problems with appetite since then. States "wobbly" on Saturday, Sunday was fine but drove to friends house and became dizzy.

## 2016-08-25 ENCOUNTER — Emergency Department: Payer: Medicare Other

## 2016-08-25 DIAGNOSIS — R42 Dizziness and giddiness: Secondary | ICD-10-CM | POA: Diagnosis not present

## 2016-08-25 LAB — URINALYSIS, COMPLETE (UACMP) WITH MICROSCOPIC
BACTERIA UA: NONE SEEN
BILIRUBIN URINE: NEGATIVE
Glucose, UA: NEGATIVE mg/dL
Hgb urine dipstick: NEGATIVE
KETONES UR: NEGATIVE mg/dL
Leukocytes, UA: NEGATIVE
Nitrite: NEGATIVE
PH: 5 (ref 5.0–8.0)
PROTEIN: NEGATIVE mg/dL
Specific Gravity, Urine: 1.019 (ref 1.005–1.030)

## 2016-08-25 LAB — TROPONIN I

## 2016-08-25 LAB — MAGNESIUM: Magnesium: 2.2 mg/dL (ref 1.7–2.4)

## 2016-08-25 MED ORDER — MECLIZINE HCL 25 MG PO TABS: 25.0000 mg | ORAL_TABLET | Freq: Three times a day (TID) | ORAL | 0 refills | Status: AC | PRN

## 2016-08-25 MED ORDER — MECLIZINE HCL 25 MG PO TABS
25.0000 mg | ORAL_TABLET | Freq: Once | ORAL | Status: AC
  Administered 2016-08-25: 25 mg via ORAL
  Filled 2016-08-25: qty 1

## 2016-08-25 MED ORDER — ACETAMINOPHEN 500 MG PO TABS
1000.0000 mg | ORAL_TABLET | Freq: Once | ORAL | Status: DC
  Filled 2016-08-25: qty 2

## 2016-08-25 NOTE — ED Provider Notes (Signed)
[facility] Emergency Department Provider Note   ____________________________________________   First MD Initiated Contact with Patient 08/25/16 0025     (approximate)  I have reviewed the triage vital signs and the nursing notes.   HISTORY  Chief Complaint Dizziness    HPI Deborah Johnston is a 76 y.o. female who comes into the hospital today with dizziness. The patient reports the symptoms last Wednesday night. She was wobbly. She woke up the next day and reports that she had felt a little bit dizzy and vomited 3 times in the day. She was also wobbly on Friday but she felt better on Saturday. The symptoms returned that afternoon and then she was fine all day on Sunday. She reports that today she felt a little woozy again and felt that maybe she should get checked out. The patient denies any headache or blurred vision. She's had some nosebleeds ever since she had dental work and reports that she did have a nosebleed on Wednesday. The patient had no episodes of diarrhea and no further episodes of vomiting. The patient denies chest pain or shortness of breath. She was concerned that she's had this dizziness that she's not had it before. The patient decided to come into the hospital today for evaluation.   History reviewed. No pertinent past medical history.  There are no active problems to display for this patient.   Past Surgical History:  Procedure Laterality Date  . BREAST BIOPSY    . cyst     . SHOULDER ARTHROSCOPY WITH OPEN ROTATOR CUFF REPAIR Right 09/18/2014   Procedure: SHOULDER ARTHROSCOPY,  debridement, decompression, biceps tenodesis,  and OPEN ROTATOR CUFF REPAIR;  Surgeon: Christena FlakeJohn J Poggi, MD;  Location: [facility] ORS;  Service: Orthopedics;  Laterality: Right;    Prior to Admission medications   Medication Sig Start Date End Date Taking? Authorizing Provider  aspirin EC 81 MG tablet Take 81 mg by mouth daily.    [provider]  b complex  vitamins tablet Take 1 tablet by mouth daily.    [provider]  Cholecalciferol (VITAMIN D-3 PO) Take 1 tablet by mouth daily.    [provider]  Coenzyme Q10 (CO Q 10 PO) Take 1 tablet by mouth daily.    [provider]  diclofenac sodium (VOLTAREN) 1 % GEL Apply 1 application topically 4 (four) times daily as needed.    [provider]  folic acid (FOLVITE) 1 MG tablet Take 1 tablet by mouth daily.    [provider]  magnesium oxide (MAG-OX) 400 MG tablet Take 400 mg by mouth daily.    [provider]  meclizine (ANTIVERT) 25 MG tablet Take 1 tablet (25 mg total) by mouth 3 (three) times daily as needed for dizziness. 08/25/16   Rebecka ApleyWebster, Allison P, MD  Multiple Minerals (MULTI-MINERALS PO) Take 1 tablet by mouth daily.    [provider]  omega-3 acid ethyl esters (LOVAZA) 1 G capsule Take 1 g by mouth daily.    [provider]  traMADol (ULTRAM) 50 MG tablet 1-2 tablets by mouth every 4-6 hours prn pain 09/18/14   Poggi, Excell SeltzerJohn J, MD  vitamin C (ASCORBIC ACID) 500 MG tablet Take 500 mg by mouth 2 (two) times daily.    [provider]    Allergies Doxycycline and Morphine and related  History reviewed. No pertinent family history.  Social History Social History  Substance Use Topics  . Smoking status: Former Smoker  Quit date: 09/16/1981  . Smokeless tobacco: Not on file  . Alcohol use Yes    Review of Systems  Constitutional: No fever/chills Eyes: No visual changes. ENT: No sore throat. Cardiovascular: Denies chest pain. Respiratory: Denies shortness of breath. Gastrointestinal: Vomiting without No abdominal pain,  No diarrhea.  No constipation. Genitourinary: Negative for dysuria. Musculoskeletal: Negative for back pain. Skin: Negative for rash. Neurological: Dizziness.   ____________________________________________   PHYSICAL EXAM:  VITAL SIGNS: ED Triage Vitals  Enc Vitals Group      BP 08/24/16 2209 (!) 144/76     Pulse Rate 08/24/16 2209 79     Resp 08/24/16 2209 19     Temp 08/24/16 2209 97.9 F (36.6 C)     Temp Source 08/24/16 2209 Oral     SpO2 08/24/16 2209 95 %     Weight 08/24/16 2210 180 lb (81.6 kg)     Height 08/24/16 2210 5' 8.5" (1.74 m)     Head Circumference --      Peak Flow --      Pain Score --      Pain Loc --      Pain Edu? --      Excl. in GC? --     Constitutional: Alert and oriented. Well appearing and in mild distress. Eyes: Conjunctivae are normal. PERRL. EOMI. Head: Atraumatic. Nose: No congestion/rhinnorhea. Mouth/Throat: Mucous membranes are moist.  Oropharynx non-erythematous. Cardiovascular: Normal rate, regular rhythm. Grossly normal heart sounds.  Good peripheral circulation. Respiratory: Normal respiratory effort.  No retractions. Lungs CTAB. Gastrointestinal: Soft and nontender. No distention. Positive bowel sounds Musculoskeletal: No lower extremity tenderness nor edema.   Neurologic:  Normal speech and language. Cranial nerves II through XII are grossly intact with no focal motor or neuro deficits Skin:  Skin is warm, dry and intact.  Psychiatric: Mood and affect are normal.   ____________________________________________   LABS (all labs ordered are listed, but only abnormal results are displayed)  Labs Reviewed  BASIC METABOLIC PANEL - Abnormal; Notable for the following:       Result Value   Glucose, Bld 123 (*)    All other components within normal limits  URINALYSIS, COMPLETE (UACMP) WITH MICROSCOPIC - Abnormal; Notable for the following:    Color, Urine YELLOW (*)    APPearance CLEAR (*)    Squamous Epithelial / LPF 0-5 (*)    All other components within normal limits  CBC  TROPONIN I  MAGNESIUM  TROPONIN I  CBG MONITORING, ED   ____________________________________________  EKG  ED ECG REPORT I, Rebecka ApleyWebster,  Allison P, the attending physician, personally viewed and interpreted this ECG.   Date:  08/24/2016  EKG Time: 2212  Rate: 83  Rhythm: normal sinus rhythm  Axis: normal  Intervals:none  ST&T Change: none  ____________________________________________  RADIOLOGY  Ct Head Wo Contrast  Result Date: 08/25/2016 CLINICAL DATA:  76 year old female with dizziness. EXAM: CT HEAD WITHOUT CONTRAST TECHNIQUE: Contiguous axial images were obtained from the base of the skull through the vertex without intravenous contrast. COMPARISON:  None. FINDINGS: Brain: The ventricles and sulci appropriate size for patient's age. Mild periventricular and deep white matter chronic microvascular ischemic changes noted. There bilateral basal ganglia calcification. A 4 mm high attenuating focus in the right cerebellar hemisphere most consistent with a focus of calcification and likely sequela of prior insult. A small focus of hemorrhage is much less likely. No acute intracranial hemorrhage. No mass effect or midline shift. No extra-axial fluid collection.  Vascular: No hyperdense vessel or unexpected calcification. Skull: Normal. Negative for fracture or focal lesion. Sinuses/Orbits: Mild mucoperiosteal thickening of paranasal sinuses. No air-fluid levels. The mastoid air cells are clear. Other: None IMPRESSION: 1. No definite acute intracranial hemorrhage. Small focus of high attenuation in the right cerebellar hemisphere most likely an area of calcification. Direct comparison with prior images, if available, recommended. If there is high clinical concern for acute hemorrhage this can be followed up with CT. 2. Mild chronic microvascular ischemic changes. Electronically Signed   By: Elgie Collard M.D.   On: 08/25/2016 01:09   Mr Brain Wo Contrast  Result Date: 08/25/2016 CLINICAL DATA:  76 y/o  F; dizziness and off balance. EXAM: MRI HEAD WITHOUT CONTRAST TECHNIQUE: Multiplanar, multiecho pulse sequences of the brain and surrounding structures were obtained without intravenous contrast. COMPARISON:  08/25/2016 CT  head FINDINGS: Brain: No acute infarction, hemorrhage, hydrocephalus, extra-axial collection or mass lesion. Mild brain parenchymal volume loss. Vascular: Normal flow voids. Skull and upper cervical spine: Normal marrow signal. Sinuses/Orbits: Mild ethmoid sinus mucosal thickening. Otherwise negative. Other: None. IMPRESSION: 1. Mild ethmoid sinus disease. 2. No acute intracranial abnormality. 3. Unremarkable MRI of the brain for age. Electronically Signed   By: Mitzi Hansen M.D.   On: 08/25/2016 02:18    ____________________________________________   PROCEDURES  Procedure(s) performed: None  Procedures  Critical Care performed: No  ____________________________________________   INITIAL IMPRESSION / ASSESSMENT AND PLAN / ED COURSE  Pertinent labs & imaging results that were available during my care of the patient were reviewed by me and considered in my medical decision making (see chart for details).  This is a 76 year old female who comes into the hospital today with dizziness. Sinuses did a CT scan and some blood work. The patient's CT scan showed some area in her cerebellum of possible calcification versus hemorrhage. Given the patient's balance difficulties I did send her for an MRI and her MRI was unremarkable. The patient did receive a dose of meclizine and some blood work including 2 troponins which are negative. I informed the patient that this is possibly vertigo as her studies are all unremarkable. I did encourage the patient to follow-up with the acute care clinic and with neurology. The patient reports that she is visiting from [place] but she is here until September. The patient be discharged home.      ____________________________________________   FINAL CLINICAL IMPRESSION(S) / ED DIAGNOSES  Final diagnoses:  Dizziness      NEW MEDICATIONS STARTED DURING THIS VISIT:  Discharge Medication List as of 08/25/2016  3:59 AM    START taking these medications    Details  meclizine (ANTIVERT) 25 MG tablet Take 1 tablet (25 mg total) by mouth 3 (three) times daily as needed for dizziness., Starting Tue 08/25/2016, Print         Note:  This document was prepared using Dragon voice recognition software and may include unintentional dictation errors.    Rebecka Apley, MD 08/25/16 [phone]

## 2020-06-21 ENCOUNTER — Telehealth: Payer: Self-pay | Admitting: Internal Medicine

## 2020-06-21 NOTE — Telephone Encounter (Signed)
Left message to call office

## 2020-06-21 NOTE — Telephone Encounter (Signed)
Patient came in today asking if Dr Alphonsus Sias would do "nutritional lab work" prior to her appt. Patient came in the office and was very rude. 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  I told her that she would need to see the provider first to discuss and she was not having it. Can you please give her a call to discuss.   EM

## 2020-07-24 ENCOUNTER — Ambulatory Visit: Payer: Medicare Other | Admitting: Internal Medicine

## 2020-10-08 ENCOUNTER — Other Ambulatory Visit: Payer: Self-pay | Admitting: Family Medicine

## 2020-10-08 DIAGNOSIS — Z1231 Encounter for screening mammogram for malignant neoplasm of breast: Secondary | ICD-10-CM

## 2020-10-31 ENCOUNTER — Other Ambulatory Visit: Payer: Self-pay

## 2020-10-31 ENCOUNTER — Ambulatory Visit
Admission: RE | Admit: 2020-10-31 | Discharge: 2020-10-31 | Disposition: A | Discharge status: Home / Self Care | Payer: Medicare Other | Source: Ambulatory Visit | Attending: Family Medicine | Admitting: Family Medicine

## 2020-10-31 DIAGNOSIS — Z1231 Encounter for screening mammogram for malignant neoplasm of breast: Secondary | ICD-10-CM | POA: Diagnosis not present

## 2021-12-19 IMAGING — MG MM DIGITAL SCREENING BILAT W/ TOMO AND CAD
6 of 12 series · 6 of 36 positions shown · non-contrast
Comparison: Previous exam(s).

CLINICAL DATA: Screening.

EXAM:
DIGITAL SCREENING BILATERAL MAMMOGRAM WITH TOMOSYNTHESIS AND CAD
TECHNIQUE: Bilateral screening digital craniocaudal and mediolateral oblique
mammograms were obtained. Bilateral screening digital breast
tomosynthesis was performed. The images were evaluated with
computer-aided detection.

[L MLO synth-2D (1 of 2)]
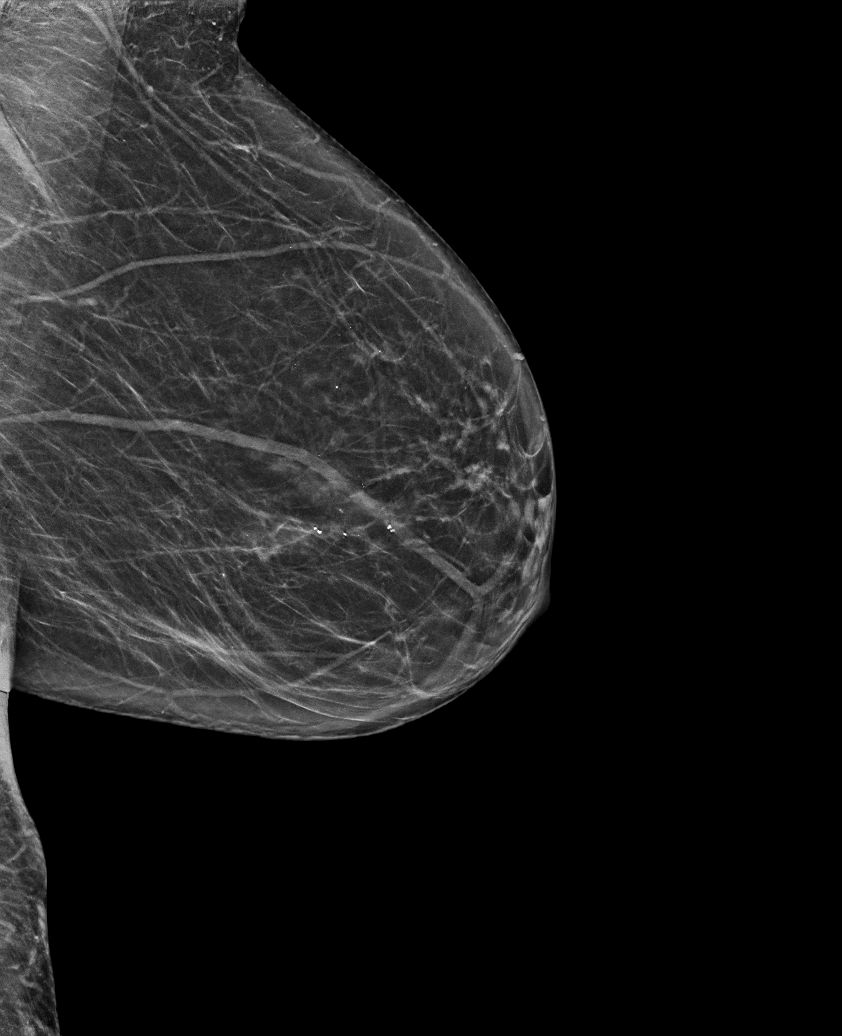

[R MLO synth-2D (1 of 2)]
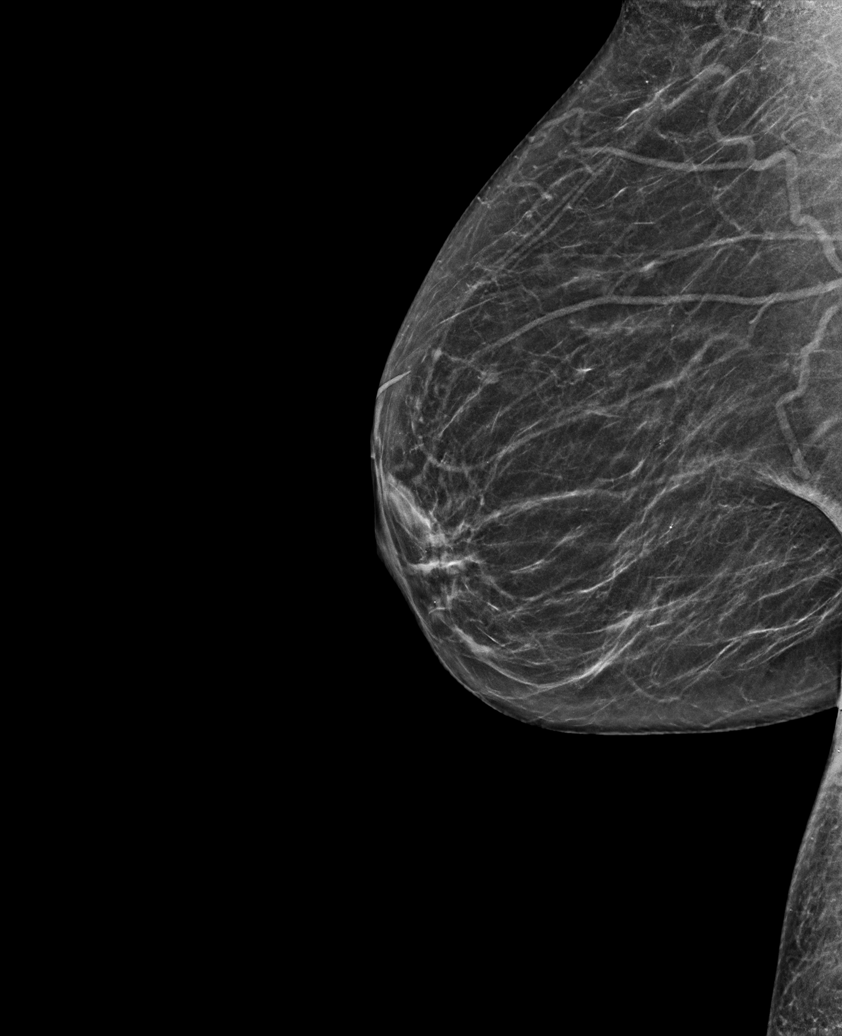

[R MLO synth-2D (2 of 2)]
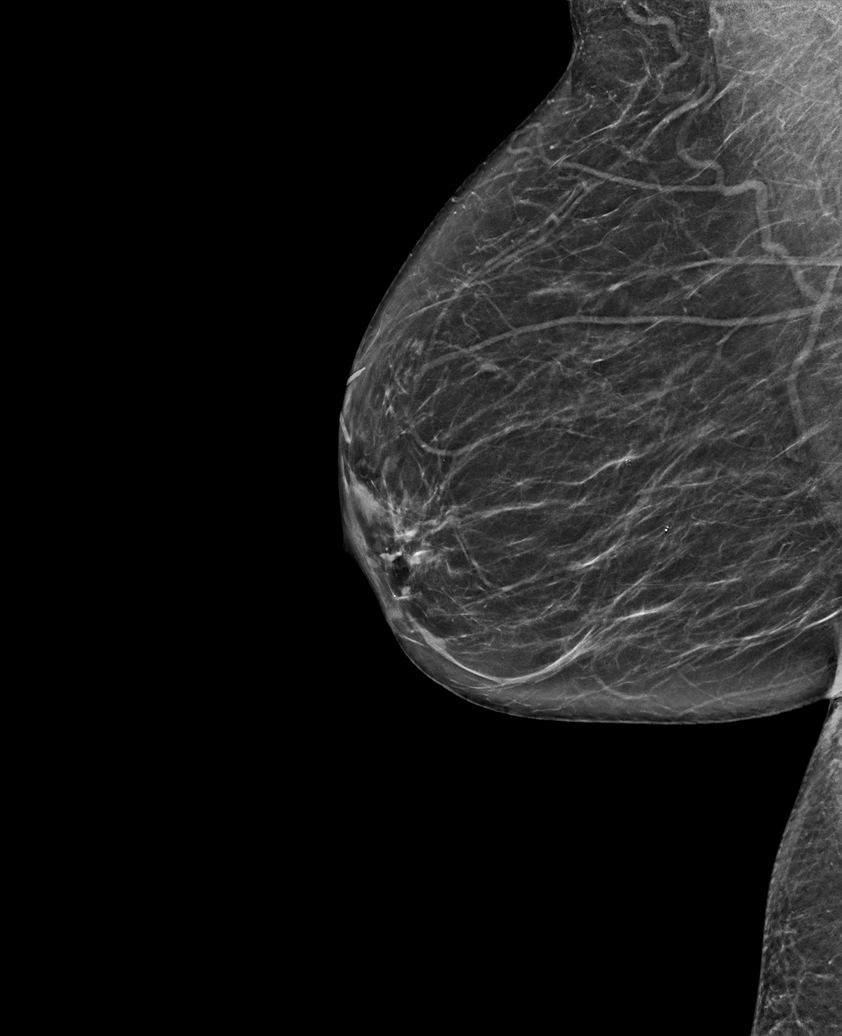

[R CC synth-2D]
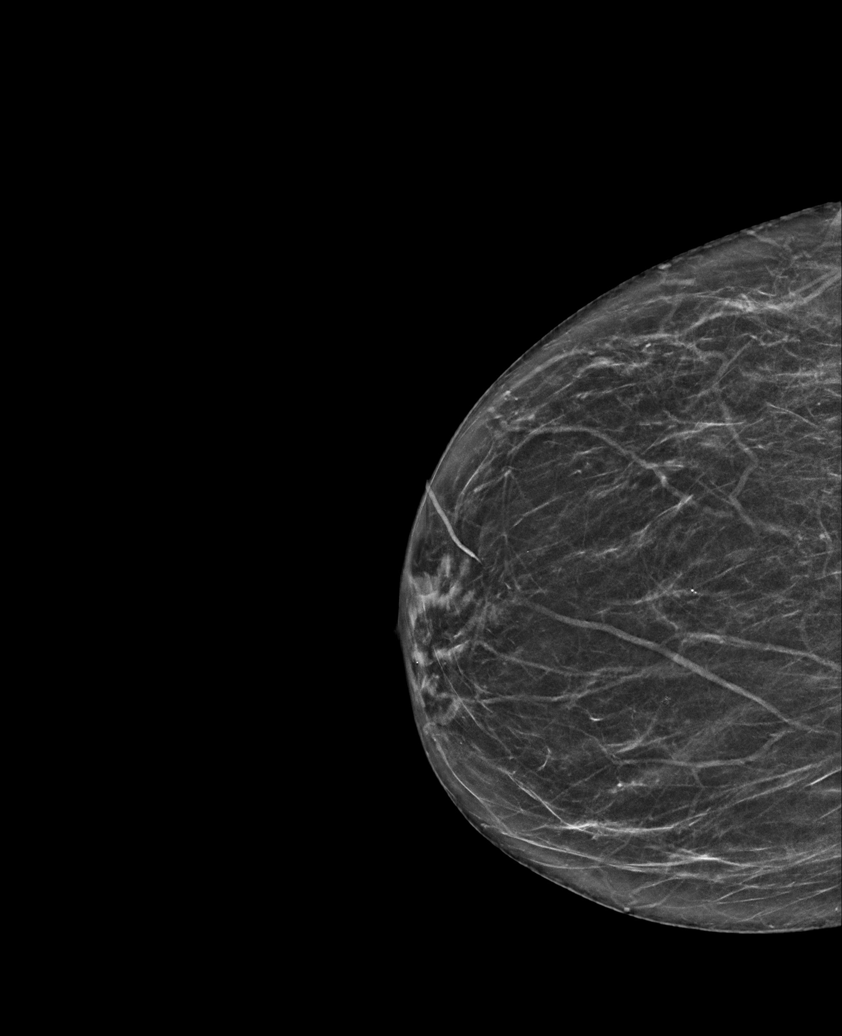

[L MLO synth-2D (2 of 2)]
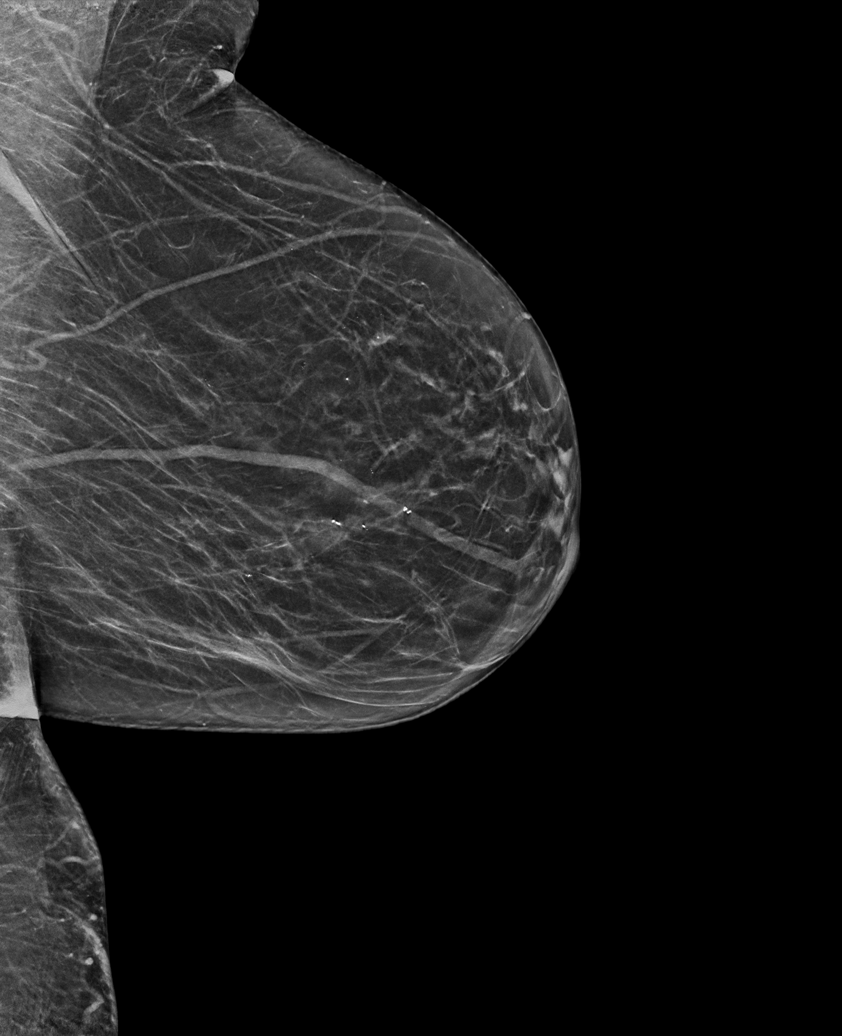

[L CC synth-2D]
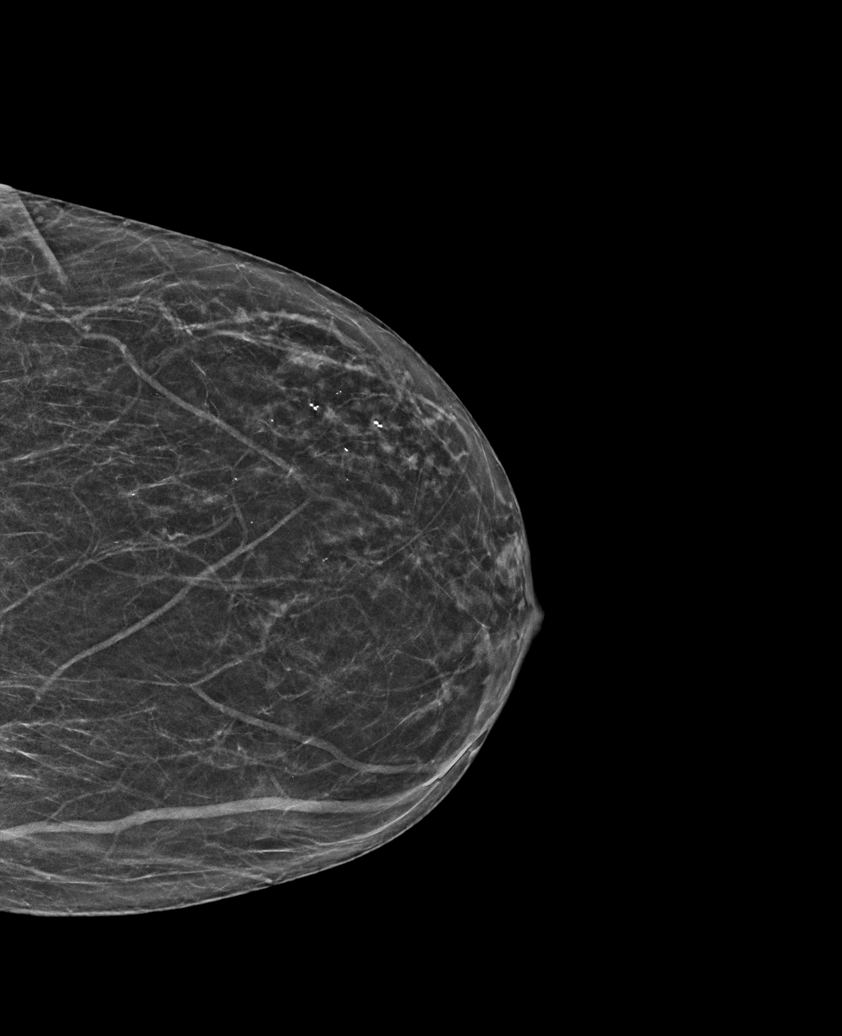

[6 of 36 positions shown; findings below may reference images not displayed]

ACR Breast Density Category b: There are scattered areas of
fibroglandular density.
FINDINGS: There are no findings suspicious for malignancy.
IMPRESSION: No mammographic evidence of malignancy. A result letter of this
screening mammogram will be mailed directly to the patient.

RECOMMENDATION:
Screening mammogram in one year. (Code:51-O-LD2)

BI-RADS CATEGORY  1: Negative.

## 2022-09-03 ENCOUNTER — Other Ambulatory Visit: Payer: Self-pay | Admitting: Family Medicine

## 2022-09-03 DIAGNOSIS — Z1231 Encounter for screening mammogram for malignant neoplasm of breast: Secondary | ICD-10-CM

## 2022-09-17 ENCOUNTER — Ambulatory Visit
Admission: RE | Admit: 2022-09-17 | Discharge: 2022-09-17 | Disposition: A | Discharge status: Home / Self Care | Payer: Medicare Other | Source: Ambulatory Visit | Attending: Family Medicine | Admitting: Family Medicine

## 2022-09-17 DIAGNOSIS — Z1231 Encounter for screening mammogram for malignant neoplasm of breast: Secondary | ICD-10-CM | POA: Insufficient documentation

## 2023-11-18 ENCOUNTER — Other Ambulatory Visit: Payer: Self-pay | Admitting: Family Medicine

## 2023-11-18 DIAGNOSIS — Z1231 Encounter for screening mammogram for malignant neoplasm of breast: Secondary | ICD-10-CM

## 2023-12-21 ENCOUNTER — Ambulatory Visit
Admission: RE | Admit: 2023-12-21 | Discharge: 2023-12-21 | Disposition: A | Discharge status: Home / Self Care | Source: Ambulatory Visit | Attending: Family Medicine | Admitting: Family Medicine

## 2023-12-21 DIAGNOSIS — Z1231 Encounter for screening mammogram for malignant neoplasm of breast: Secondary | ICD-10-CM | POA: Diagnosis present
